# Patient Record
Sex: Male | Born: 2000 | Race: White | Hispanic: No | Marital: Single | Smoking: Never smoker
Health system: Southern US, Community
[De-identification: ages and names within clinical notes are randomized; demographics above are authoritative.]

---
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
## 2019-10-29 ENCOUNTER — Encounter: Payer: Self-pay | Admitting: Family Medicine

## 2019-10-30 ENCOUNTER — Encounter: Payer: Self-pay | Admitting: Family Medicine
